# Patient Record
Sex: Male | Born: 1935 | Race: White | Hispanic: No | Marital: Single
Health system: Southern US, Community
[De-identification: ages and names within clinical notes are randomized; demographics above are authoritative.]

---

## 2004-12-09 ENCOUNTER — Ambulatory Visit (HOSPITAL_COMMUNITY): Admission: RE | Admit: 2004-12-09 | Discharge: 2004-12-09 | Payer: Self-pay | Admitting: *Deleted

## 2005-01-04 ENCOUNTER — Ambulatory Visit: Payer: Self-pay | Admitting: *Deleted

## 2005-02-10 ENCOUNTER — Ambulatory Visit: Payer: Self-pay | Admitting: Unknown Physician Specialty

## 2005-02-26 ENCOUNTER — Emergency Department: Payer: Self-pay | Admitting: Unknown Physician Specialty

## 2005-02-26 ENCOUNTER — Other Ambulatory Visit: Payer: Self-pay

## 2006-06-29 ENCOUNTER — Ambulatory Visit: Payer: Self-pay | Admitting: Specialist

## 2006-06-29 ENCOUNTER — Other Ambulatory Visit: Payer: Self-pay

## 2006-07-21 ENCOUNTER — Ambulatory Visit: Payer: Self-pay | Admitting: Specialist

## 2006-08-03 ENCOUNTER — Ambulatory Visit: Payer: Self-pay | Admitting: Surgery

## 2006-08-10 ENCOUNTER — Ambulatory Visit: Payer: Self-pay | Admitting: Surgery

## 2006-12-29 ENCOUNTER — Ambulatory Visit: Payer: Self-pay | Admitting: Family Medicine

## 2007-01-09 ENCOUNTER — Other Ambulatory Visit: Payer: Self-pay

## 2007-01-10 ENCOUNTER — Inpatient Hospital Stay: Payer: Self-pay | Admitting: Unknown Physician Specialty

## 2007-01-21 ENCOUNTER — Ambulatory Visit: Payer: Self-pay | Admitting: Family Medicine

## 2007-04-26 ENCOUNTER — Ambulatory Visit: Payer: Self-pay | Admitting: Family Medicine

## 2007-05-21 ENCOUNTER — Ambulatory Visit: Payer: Self-pay | Admitting: Family Medicine

## 2007-09-07 ENCOUNTER — Ambulatory Visit: Payer: Self-pay | Admitting: Family Medicine

## 2007-12-13 ENCOUNTER — Ambulatory Visit: Payer: Self-pay | Admitting: Family Medicine

## 2007-12-18 ENCOUNTER — Ambulatory Visit: Payer: Self-pay | Admitting: Unknown Physician Specialty

## 2008-02-05 ENCOUNTER — Emergency Department: Payer: Self-pay | Admitting: Emergency Medicine

## 2009-01-06 ENCOUNTER — Ambulatory Visit: Payer: Self-pay | Admitting: Specialist

## 2009-12-09 ENCOUNTER — Ambulatory Visit: Payer: Self-pay

## 2010-04-27 ENCOUNTER — Inpatient Hospital Stay: Payer: Self-pay | Admitting: Internal Medicine

## 2010-05-07 ENCOUNTER — Inpatient Hospital Stay: Payer: Self-pay | Admitting: Internal Medicine

## 2010-05-22 ENCOUNTER — Ambulatory Visit: Payer: Self-pay | Admitting: Gastroenterology

## 2012-04-04 ENCOUNTER — Emergency Department: Payer: Self-pay | Admitting: Emergency Medicine

## 2012-04-04 LAB — URINALYSIS, COMPLETE
Bacteria: NONE SEEN
Bilirubin,UR: NEGATIVE
Ketone: NEGATIVE
Nitrite: NEGATIVE
Protein: 30
Specific Gravity: 1.018 (ref 1.003–1.030)

## 2012-04-04 LAB — CBC WITH DIFFERENTIAL/PLATELET
Basophil %: 0.4 %
HCT: 39.7 % — ABNORMAL LOW (ref 40.0–52.0)
HGB: 12.7 g/dL — ABNORMAL LOW (ref 13.0–18.0)
Lymphocyte #: 5.6 10*3/uL — ABNORMAL HIGH (ref 1.0–3.6)
Lymphocyte %: 31 %
MCH: 29.8 pg (ref 26.0–34.0)
MCHC: 32 g/dL (ref 32.0–36.0)
Monocyte #: 0.8 x10 3/mm (ref 0.2–1.0)
Monocyte %: 4.3 %
Neutrophil #: 11.4 10*3/uL — ABNORMAL HIGH (ref 1.4–6.5)
Platelet: 234 10*3/uL (ref 150–440)
RBC: 4.26 10*6/uL — ABNORMAL LOW (ref 4.40–5.90)

## 2012-04-04 LAB — COMPREHENSIVE METABOLIC PANEL
Albumin: 3.7 g/dL (ref 3.4–5.0)
Alkaline Phosphatase: 90 U/L (ref 50–136)
Bilirubin,Total: 0.4 mg/dL (ref 0.2–1.0)
Calcium, Total: 8.3 mg/dL — ABNORMAL LOW (ref 8.5–10.1)
Chloride: 100 mmol/L (ref 98–107)
EGFR (African American): >60
Osmolality: 281 (ref 275–301)
Potassium: 4.2 mmol/L (ref 3.5–5.1)
SGOT(AST): 39 U/L — ABNORMAL HIGH (ref 15–37)
Sodium: 137 mmol/L (ref 136–145)

## 2012-04-04 LAB — PROTIME-INR: INR: 1

## 2012-04-04 LAB — CK TOTAL AND CKMB (NOT AT ARMC): CK, Total: 330 U/L — ABNORMAL HIGH (ref 35–232)

## 2012-04-04 LAB — MAGNESIUM: Magnesium: 1.6 mg/dL — ABNORMAL LOW

## 2012-04-22 DEATH — deceased

## 2012-10-02 IMAGING — CT CT ABD-PELV W/ CM
1 of 3 series · 14 of 32 positions shown, 19 images · non-contrast
Comparison: none

REASON FOR EXAM: (1) new onset pancreatitis non-diagnostic US; (2) new
onset pancreatitis non-dia
COMMENTS:

[Series 2: 3mm soft tissue · axial · 0.72mm/px · z∈[-734,-290]mm · 14 of 170 slices shown, 19 images]
[im 11/170  soft-tissue]
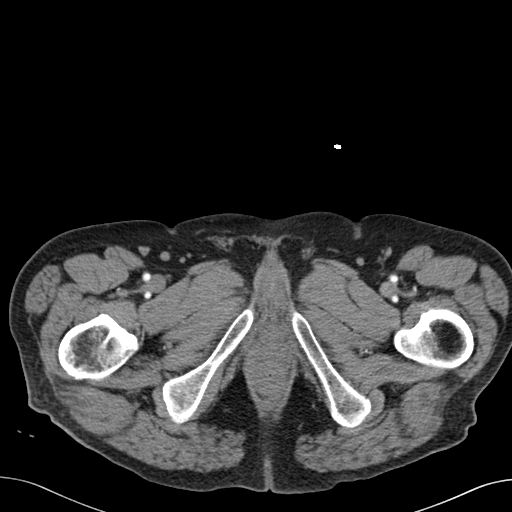
[im 11/170  bone]
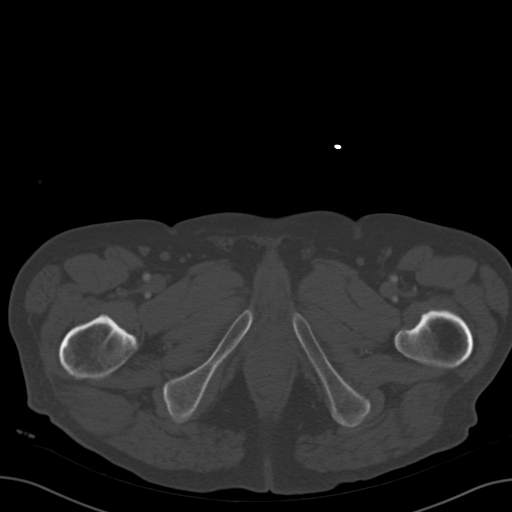
[im 22/170  soft-tissue]
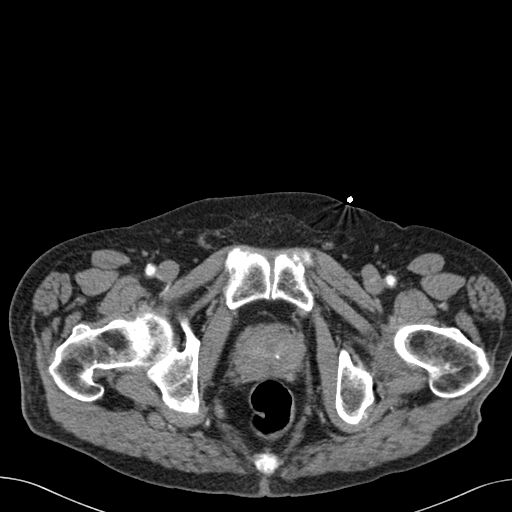
[im 32/170  soft-tissue]
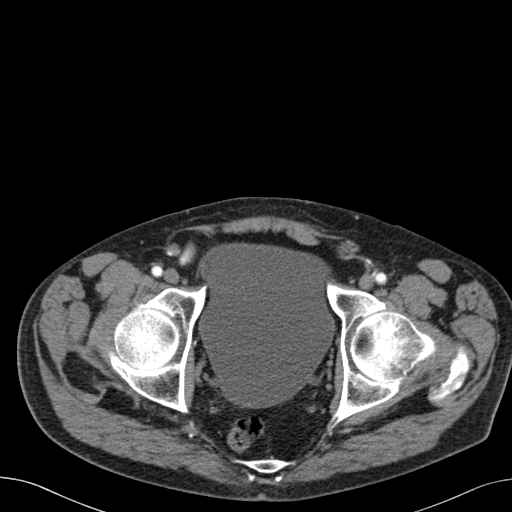
[im 53/170  soft-tissue]
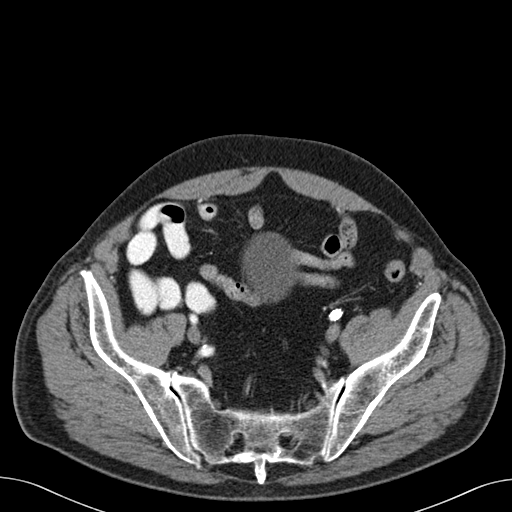
[im 64/170  soft-tissue]
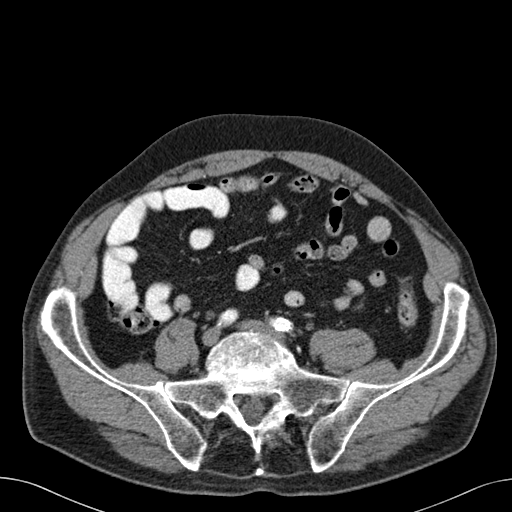
[im 74/170  soft-tissue]
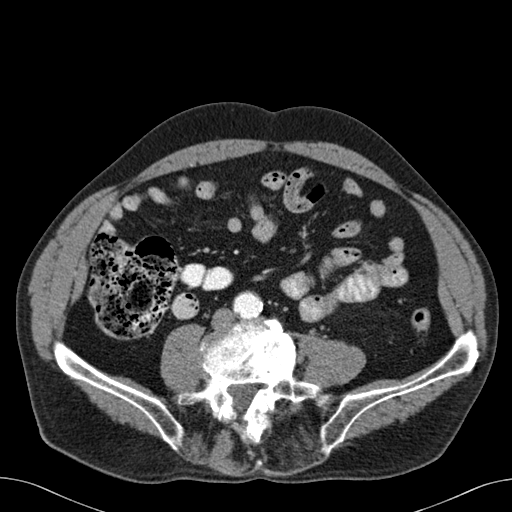
[im 85/170  soft-tissue]
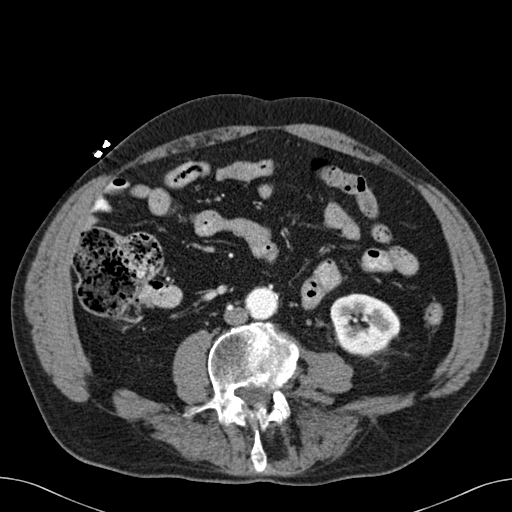
[im 96/170  soft-tissue]
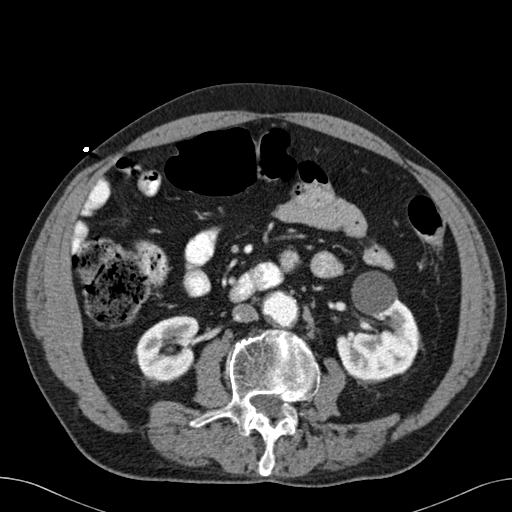
[im 106/170  soft-tissue]
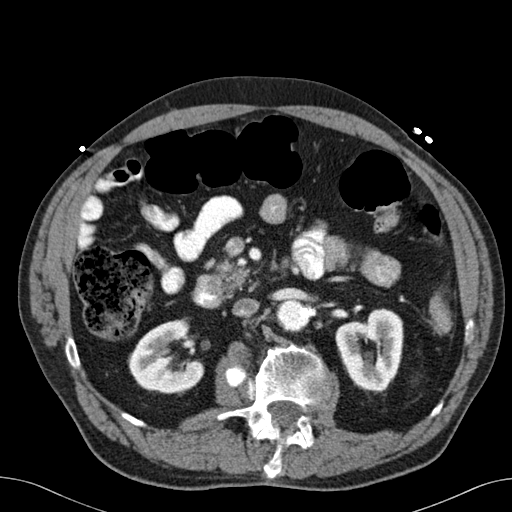
[im 106/170  bone]
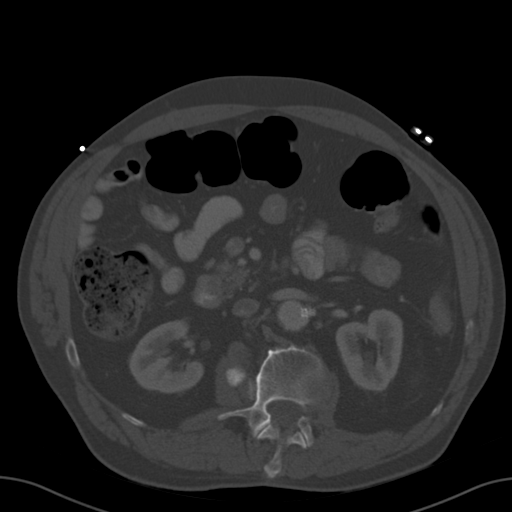
[im 117/170  soft-tissue]
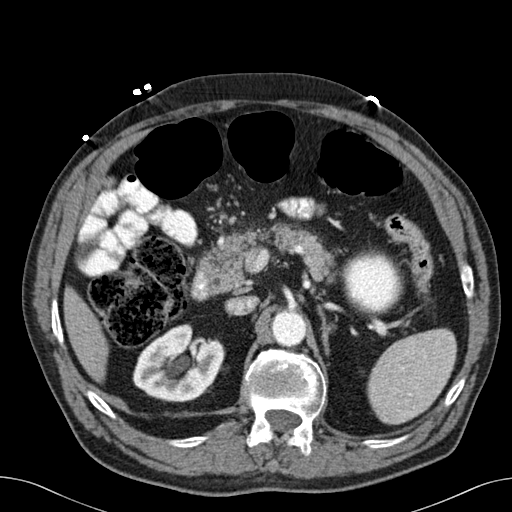
[im 127/170  lung]
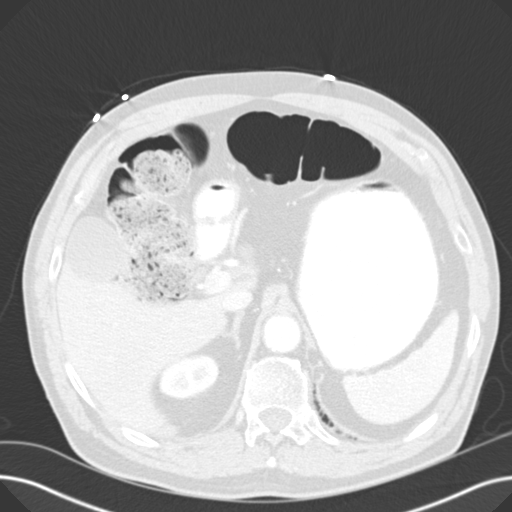
[im 138/170  soft-tissue]
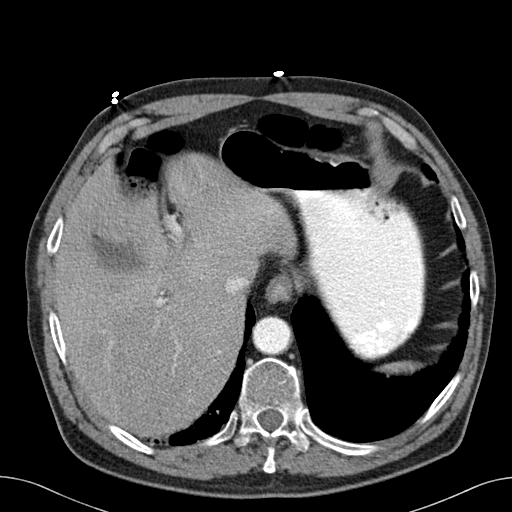
[im 138/170  lung]
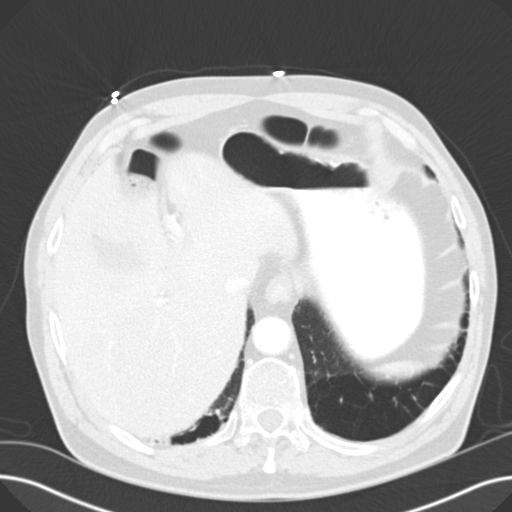
[im 148/170  soft-tissue]
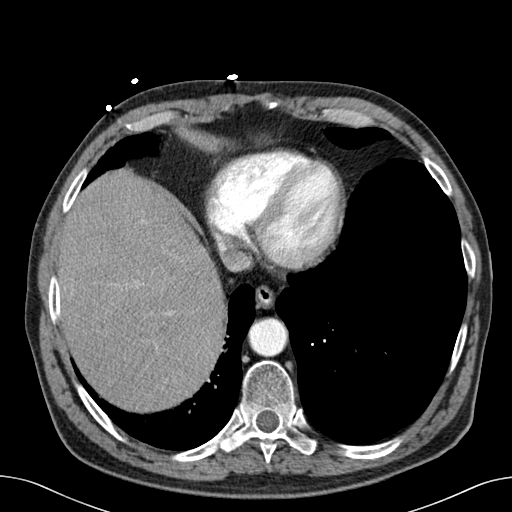
[im 148/170  lung]
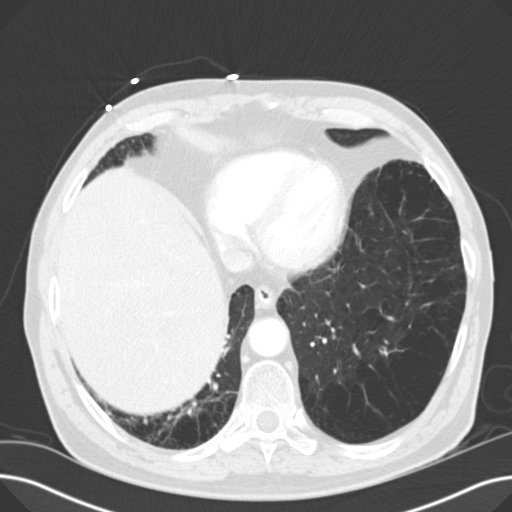
[im 159/170  soft-tissue]
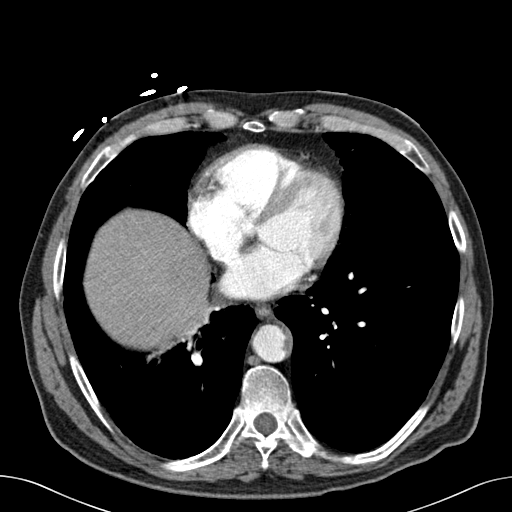
[im 159/170  lung]
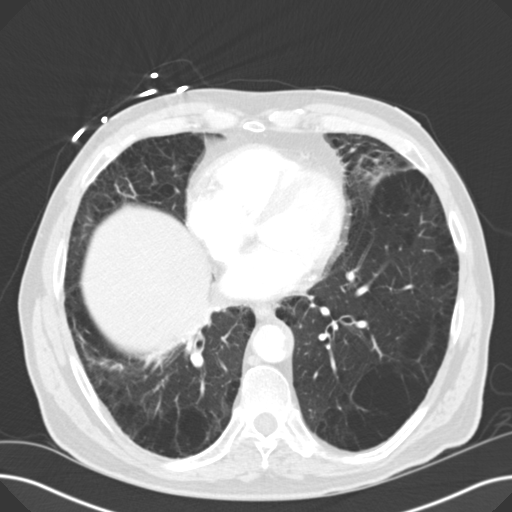

[14 of 32 positions shown; findings below may reference images not displayed]

PROCEDURE:     CT  - CT ABDOMEN / PELVIS  W  - April 27, 2010  [DATE]

RESULT:     Helical 3 mm sections were obtained from the lung bases through
the pubic symphysis status post intravenous administration of 100 ml of
Osovue-MTX. Comparison is made to a prior study dated 01/06/2009.

The lung bases demonstrate emphysematous as well as fibrotic changes. The
emphysematous changes are severe.

Diffuse low-attenuation projects within the liver parenchyma. A rounded area
of focal low-attenuation projects in the base of the left lobe of the liver.
The spleen, adrenals, and right kidney are unremarkable. Evaluation of the
pancreas demonstrates mild stranding within the peripancreatic fat in the
region of the head and uncinate process. The pancreas otherwise is
unremarkable. There is no evidence of an abdominal aortic aneurysm. A cyst
is appreciated involving the left kidney which appears stable. There is no
evidence of abdominal or pelvic free fluid, loculated fluid collections,
masses or adenopathy. The urinary bladder is distended with urine. There is
no evidence of bowel obstruction.
IMPRESSION: 1. Findings which may represent the sequela of very mild pancreatitis in the
uncinate process and head region.
2. Severe emphysematous changes within the lung bases.
3. Otherwise, no evidence of further obstructive or inflammatory
abnormalities.

Dr. Blair Larkin of the Emergency Department was informed of these
findings via a preliminary faxed report.

## 2014-07-12 NOTE — Op Note (Signed)
PATIENT NAME:  Joe Osborne, Joe Osborne MR#:  [id number] DATE OF BIRTH:  1934-09-09  DATE OF PROCEDURE:  04/04/2012  PREOPERATIVE DIAGNOSIS: Traumatic bilateral pneumothoraces.   POSTOPERATIVE DIAGNOSIS: Traumatic bilateral pneumothoraces.   PROCEDURE PERFORMED: Right chest tube insertion, 32 [place].   SURGEON: Willo Yoon A. Egbert GaribaldiBird, MD FACS  ANESTHESIA: 10 mL of 1% lidocaine, patient being on a ventilator, Fentanyl and Versed.   FINDINGS: Rush of air. Chest x-ray pending. Air leak seen on Pleurovac.   DESCRIPTION OF PROCEDURE: With informed consent obtained from the patient's wife, the right chest was sterilely prepped and draped with Betadine solution, timeout was observed and 10 mL of 1% lidocaine was infiltrated along the mid axillary line, on the right side, at approximately the sixth to seventh intercostal space. Scalpel was used to divide skin and subcutaneous tissues were then divided bluntly. The superior border of the rib was identified by palpation. Utilizing a large Kelly clamp, pleural space was entered and large rush of air was encountered. A 32 French straight chest tube was inserted and directed anteriorly. The chest tube site was secured at two points with #0 silk suture. The patient tolerated the procedure well without immediate complication. Sterile Vaseline gauze dressing was placed. The left chest tube was placed by the Emergency Room physician simultaneously. He is currently being transfered to a higher level of care.  ____________________________ Redge GainerMark A. Egbert GaribaldiBird, MD  FACS mab:sb D: 04/04/2012 23:35:46 ET T: 04/05/2012 07:07:29 ET JOB#: [id number]  cc: Loraine LericheMark A. Egbert GaribaldiBird, MD, <Dictator> Rhona LeavensJames F. Burnett ShengHedrick, MD Delvonte Berenson Kela MillinA Clara Smolen MD ELECTRONICALLY SIGNED 04/06/2012 22:31
